# Patient Record
Sex: Female | Born: 1965
Health system: Southern US, Community
[De-identification: ages and names within clinical notes are randomized; demographics above are authoritative.]

## PROBLEM LIST (undated history)

## (undated) DIAGNOSIS — D649 Anemia, unspecified: Secondary | ICD-10-CM

## (undated) DIAGNOSIS — E079 Disorder of thyroid, unspecified: Secondary | ICD-10-CM

## (undated) DIAGNOSIS — B019 Varicella without complication: Secondary | ICD-10-CM

## (undated) DIAGNOSIS — T7840XA Allergy, unspecified, initial encounter: Secondary | ICD-10-CM

## (undated) HISTORY — DX: Anemia, unspecified: D64.9

## (undated) HISTORY — PX: JOINT REPLACEMENT: SHX530

## (undated) HISTORY — PX: KNEE ARTHROSCOPY: SUR90

## (undated) HISTORY — DX: Allergy, unspecified, initial encounter: T78.40XA

## (undated) HISTORY — DX: Varicella without complication: B01.9

## (undated) HISTORY — DX: Disorder of thyroid, unspecified: E07.9

---

## 2015-04-17 HISTORY — PX: JOINT REPLACEMENT: SHX530

## 2015-09-09 ENCOUNTER — Other Ambulatory Visit: Payer: Self-pay

## 2015-09-09 DIAGNOSIS — Z1231 Encounter for screening mammogram for malignant neoplasm of breast: Secondary | ICD-10-CM

## 2015-09-21 ENCOUNTER — Ambulatory Visit
Admission: RE | Admit: 2015-09-21 | Discharge: 2015-09-21 | Disposition: A | Discharge status: Home / Self Care | Payer: 59 | Source: Ambulatory Visit

## 2015-09-21 DIAGNOSIS — Z1231 Encounter for screening mammogram for malignant neoplasm of breast: Secondary | ICD-10-CM

## 2015-10-05 ENCOUNTER — Other Ambulatory Visit: Payer: Self-pay | Admitting: Obstetrics & Gynecology

## 2015-10-05 ENCOUNTER — Other Ambulatory Visit (HOSPITAL_COMMUNITY)
Admission: RE | Admit: 2015-10-05 | Discharge: 2015-10-05 | Disposition: A | Discharge status: Home / Self Care | Payer: 59 | Source: Ambulatory Visit | Attending: Obstetrics & Gynecology | Admitting: Obstetrics & Gynecology

## 2015-10-05 DIAGNOSIS — Z01419 Encounter for gynecological examination (general) (routine) without abnormal findings: Secondary | ICD-10-CM | POA: Insufficient documentation

## 2015-10-05 DIAGNOSIS — Z1151 Encounter for screening for human papillomavirus (HPV): Secondary | ICD-10-CM | POA: Diagnosis not present

## 2015-10-06 LAB — CYTOLOGY - PAP

## 2015-10-22 ENCOUNTER — Emergency Department (HOSPITAL_BASED_OUTPATIENT_CLINIC_OR_DEPARTMENT_OTHER)
Admission: EM | Admit: 2015-10-22 | Discharge: 2015-10-22 | Disposition: A | Discharge status: Home / Self Care | Payer: 59 | Attending: Emergency Medicine | Admitting: Emergency Medicine

## 2015-10-22 ENCOUNTER — Encounter (HOSPITAL_BASED_OUTPATIENT_CLINIC_OR_DEPARTMENT_OTHER): Payer: Self-pay | Admitting: *Deleted

## 2015-10-22 ENCOUNTER — Emergency Department (HOSPITAL_BASED_OUTPATIENT_CLINIC_OR_DEPARTMENT_OTHER): Payer: 59

## 2015-10-22 DIAGNOSIS — Z7982 Long term (current) use of aspirin: Secondary | ICD-10-CM | POA: Insufficient documentation

## 2015-10-22 DIAGNOSIS — Z79899 Other long term (current) drug therapy: Secondary | ICD-10-CM | POA: Insufficient documentation

## 2015-10-22 DIAGNOSIS — Z9660 Presence of unspecified orthopedic joint implant: Secondary | ICD-10-CM | POA: Diagnosis not present

## 2015-10-22 DIAGNOSIS — R0789 Other chest pain: Secondary | ICD-10-CM | POA: Diagnosis not present

## 2015-10-22 DIAGNOSIS — R0602 Shortness of breath: Secondary | ICD-10-CM | POA: Diagnosis not present

## 2015-10-22 DIAGNOSIS — R079 Chest pain, unspecified: Secondary | ICD-10-CM

## 2015-10-22 LAB — URINALYSIS, ROUTINE W REFLEX MICROSCOPIC
Bilirubin Urine: NEGATIVE
Glucose, UA: NEGATIVE mg/dL
Hgb urine dipstick: NEGATIVE
Ketones, ur: NEGATIVE mg/dL
Leukocytes, UA: NEGATIVE
Nitrite: NEGATIVE
Protein, ur: NEGATIVE mg/dL
Specific Gravity, Urine: 1.017 (ref 1.005–1.030)
pH: 5.5 (ref 5.0–8.0)

## 2015-10-22 LAB — BASIC METABOLIC PANEL
Anion gap: 8 (ref 5–15)
BUN: 12 mg/dL (ref 6–20)
CO2: 28 mmol/L (ref 22–32)
Calcium: 9.2 mg/dL (ref 8.9–10.3)
Chloride: 102 mmol/L (ref 101–111)
Creatinine, Ser: 0.82 mg/dL (ref 0.44–1.00)
GFR calc Af Amer: >60 mL/min (ref >60)
GFR calc non Af Amer: >60 mL/min (ref >60)
Glucose, Bld: 84 mg/dL (ref 65–99)
Potassium: 3.5 mmol/L (ref 3.5–5.1)
Sodium: 138 mmol/L (ref 135–145)

## 2015-10-22 LAB — BRAIN NATRIURETIC PEPTIDE: B NATRIURETIC PEPTIDE 5: 36.5 pg/mL (ref 0.0–100.0)

## 2015-10-22 LAB — CBC
HCT: 37 % (ref 36.0–46.0)
Hemoglobin: 12.2 g/dL (ref 12.0–15.0)
MCH: 27.7 pg (ref 26.0–34.0)
MCHC: 33 g/dL (ref 30.0–36.0)
MCV: 83.9 fL (ref 78.0–100.0)
PLATELETS: 296 10*3/uL (ref 150–400)
RBC: 4.41 MIL/uL (ref 3.87–5.11)
RDW: 15.3 % (ref 11.5–15.5)
WBC: 5.7 10*3/uL (ref 4.0–10.5)

## 2015-10-22 LAB — PREGNANCY, URINE: Preg Test, Ur: NEGATIVE

## 2015-10-22 LAB — TROPONIN I: Troponin I: <0.03 ng/mL (ref <0.03)

## 2015-10-22 LAB — D-DIMER, QUANTITATIVE: D-Dimer, Quant: 0.38 ug/mL-FEU (ref 0.00–0.50)

## 2015-10-22 MED ORDER — IOPAMIDOL (ISOVUE-370) INJECTION 76%
100.0000 mL | Freq: Once | INTRAVENOUS | Status: AC | PRN
  Administered 2015-10-22: 100 mL via INTRAVENOUS

## 2015-10-22 MED ORDER — PANTOPRAZOLE SODIUM 20 MG PO TBEC
20.0000 mg | DELAYED_RELEASE_TABLET | Freq: Two times a day (BID) | ORAL | Status: DC
Start: 2015-10-22 — End: 2020-11-10

## 2015-10-22 NOTE — ED Provider Notes (Signed)
CSN: [id number]     Arrival date & time 10/22/15  1237 History   First MD Initiated Contact with Patient 10/22/15 1303     No chief complaint on file.    (Consider location/radiation/quality/duration/timing/severity/associated sxs/prior Treatment) HPI   3-4 nights of chest pain with shortness of breath, waking up short of breath at night.  Chest pain severe, aching/pressure, worse laying down.  Worse when trying to sleep on right side.  Went to cross fit yesterday, and did not feel like pain got worse with exercise.  Chest pressure when laying down.  Tiny bit right now sitting up.  No hx of reflux.  No htn/hlpd/DM/smoking  No family hx of CAD  No hx of blood of blood clots, 6/15 surgery, Thursday drove 13hr to [facility] and back   History reviewed. No pertinent past medical history. Past Surgical History  Procedure Laterality Date  . Joint replacement     History reviewed. No pertinent family history. Social History  Substance Use Topics  . Smoking status: Never Smoker   . Smokeless tobacco: None  . Alcohol Use: Yes     Comment: occasional    OB History    No data available     Review of Systems  Constitutional: Negative for fever.  HENT: Negative for sore throat.   Eyes: Negative for visual disturbance.  Respiratory: Positive for shortness of breath. Negative for cough.   Cardiovascular: Positive for chest pain. Negative for leg swelling.  Gastrointestinal: Negative for nausea, vomiting and abdominal pain.  Genitourinary: Negative for difficulty urinating.  Musculoskeletal: Negative for back pain and neck pain.  Skin: Negative for rash.  Neurological: Negative for syncope, light-headedness and headaches.      Allergies  Review of patient's allergies indicates no known allergies.  Home Medications   Prior to Admission medications   Medication Sig Start Date End Date Taking? Authorizing Provider  aspirin 81 MG tablet Take 81 mg by mouth daily.   Yes Historical  Provider, MD  HYDROcodone-acetaminophen (NORCO/VICODIN) 5-325 MG tablet Take 1 tablet by mouth every 6 (six) hours as needed for moderate pain.   Yes Historical Provider, MD  ondansetron (ZOFRAN) 4 MG tablet Take 4 mg by mouth every 8 (eight) hours as needed for nausea or vomiting.   Yes Historical Provider, MD  pantoprazole (PROTONIX) 20 MG tablet Take 1 tablet (20 mg total) by mouth 2 (two) times daily. 10/22/15 11/22/15  Alvira Monday, MD   BP 119/66 mmHg  Pulse 61  Temp(Src) 97.8 F (36.6 C) (Oral)  Resp 16  Ht  (1.778 m)  Wt 160 lb (72.576 kg)  BMI 22.96 kg/m2  SpO2 100%  LMP 09/19/2015 (Approximate) Physical Exam  Constitutional: She is oriented to person, place, and time. She appears well-developed and well-nourished. No distress.  HENT:  Head: Normocephalic and atraumatic.  Eyes: Conjunctivae and EOM are normal.  Neck: Normal range of motion.  Cardiovascular: Normal rate, regular rhythm, normal heart sounds and intact distal pulses.  Exam reveals no gallop and no friction rub.   No murmur heard. Pulmonary/Chest: Effort normal and breath sounds normal. No respiratory distress. She has no wheezes. She has no rales.  Abdominal: Soft. She exhibits no distension. There is no tenderness. There is no guarding.  Musculoskeletal: She exhibits no edema or tenderness.  Neurological: She is alert and oriented to person, place, and time.  Skin: Skin is warm and dry. No rash noted. She is not diaphoretic. No erythema.  Nursing note and vitals  reviewed.   ED Course  Procedures (including critical care time) Labs Review Labs Reviewed  BASIC METABOLIC PANEL  CBC  TROPONIN I  PREGNANCY, URINE  URINALYSIS, ROUTINE W REFLEX MICROSCOPIC (NOT AT [facility])  D-DIMER, QUANTITATIVE (NOT AT [facility])  BRAIN NATRIURETIC PEPTIDE  TROPONIN I    Imaging Review Dg Chest 2 View  10/22/2015  CLINICAL DATA:  Midsternal chest pain EXAM: CHEST  2 VIEW COMPARISON:  None. FINDINGS: Normal mediastinum and  cardiac silhouette. Normal pulmonary vasculature. No evidence of effusion, infiltrate, or pneumothorax. No acute bony abnormality. Nipple shadows noted. IMPRESSION: Normal chest radiograph. Electronically Signed   By: Genevive Bi M.D.   On: 10/22/2015 13:36   Ct Angio Chest Pe W/cm &/or Wo Cm  10/22/2015  CLINICAL DATA:  50 year old female with a history of chest pain EXAM: CT ANGIOGRAPHY CHEST WITH CONTRAST TECHNIQUE: Multidetector CT imaging of the chest was performed using the standard protocol during bolus administration of intravenous contrast. Multiplanar CT image reconstructions and MIPs were obtained to evaluate the vascular anatomy. CONTRAST:  100 cc Isovue 370 COMPARISON:  Chest x-ray 10/22/2015 FINDINGS: Chest: Unremarkable appearance of the chest superficial soft tissues. No axillary or supraclavicular adenopathy. Incidental imaging of right thyroid lesion measuring 3.4 cm. Several mediastinal lymph nodes, none of which are enlarged by CT size criteria or have suspicious features. Unremarkable appearance of the esophagus. Unremarkable course caliber and contour of the thoracic aorta without dissection flap, aneurysm, periaortic fluid. No central, lobar, segmental, or proximal subsegmental filling defects to indicate pulmonary emboli. Heart size within normal limits without pericardial fluid/ thickening. No confluent airspace disease. No pneumothorax or pleural effusion. Upper abdomen: Unremarkable appearance of the visualized upper abdomen. Musculoskeletal: No displaced fracture.  No significant degenerative changes. Review of the MIP images confirms the above findings. IMPRESSION: Study is negative for pulmonary emboli. Incidental imaging of right thyroid lesion. Recommend referral for medical thyroid workup and ultrasound survey. Signed, Yvone Neu. Loreta Ave, DO [facility] Electronically Signed   By: Gilmer Mor D.O.   On: 10/22/2015 14:45    I have personally reviewed and evaluated these images and lab results as part of my medical decision-making.   EKG Interpretation   Date/Time:  Saturday October 22 2015 12:48:51 EDT Ventricular Rate:  68 PR Interval:    QRS Duration: 96 QT Interval:  393 QTC Calculation: 418 R Axis:   77 Text Interpretation:  Sinus rhythm No previous ECGs available Confirmed by  [facility] MD, Hosey Burmester ([id number]) on 10/22/2015 1:40:21 PM      MDM   Final diagnoses:  Chest pain, unspecified chest pain type  Shortness of breath   50 year old female with history of knee surgery on June 15, presents with concern of severe chest pressure, worse at night, and dyspnea. EKG was evaluated by me and showed a sinus rhythm without other acute ST abnormalities, no pericarditis. Chest x-ray showed no sign of pneumonia, no pneumothorax, heart failure. BNP within normal limits. CBC, troponin, and BNP within normal limits. While patient is low risk Wells, with negative ddimer, she reports severe pain and dyspnea, discussed other options and pt would like CT to ensure no PE and also screen for other abnormalities (i.e. mass causing positional symptoms.) CT within normal limits without pericardial thickening, no PE, no other large masses. Incidental right thyroid lesion and recommend outpatient follow up.  Patient is low risk HEAR score and delta troponins both negative.  Given this evaluation, history and physical have low  suspicion for pulmonary embolus, pneumonia, ACS, myocarditis, pericarditis, dissection.   Will rx BID PPI for possible reflux, and recommend close PCP follow up within one week. Patient discharged in stable condition with understanding of reasons to return.   Alvira MondayErin Trenity Pha, MD 10/22/15 [phone]

## 2015-10-22 NOTE — ED Notes (Signed)
Per pt report chest pain to center of chest with tightness that was off and on, recent rt knee surgery.

## 2016-11-14 ENCOUNTER — Other Ambulatory Visit: Payer: Self-pay | Admitting: Obstetrics & Gynecology

## 2016-11-14 DIAGNOSIS — Z1231 Encounter for screening mammogram for malignant neoplasm of breast: Secondary | ICD-10-CM

## 2016-11-20 ENCOUNTER — Ambulatory Visit: Payer: 59

## 2016-11-23 ENCOUNTER — Ambulatory Visit
Admission: RE | Admit: 2016-11-23 | Discharge: 2016-11-23 | Disposition: A | Discharge status: Home / Self Care | Payer: 59 | Source: Ambulatory Visit | Attending: Obstetrics & Gynecology | Admitting: Obstetrics & Gynecology

## 2016-11-23 DIAGNOSIS — Z1231 Encounter for screening mammogram for malignant neoplasm of breast: Secondary | ICD-10-CM

## 2017-10-10 ENCOUNTER — Other Ambulatory Visit: Payer: Self-pay | Admitting: Obstetrics & Gynecology

## 2017-10-10 DIAGNOSIS — Z1231 Encounter for screening mammogram for malignant neoplasm of breast: Secondary | ICD-10-CM

## 2017-10-30 ENCOUNTER — Encounter: Payer: Self-pay | Admitting: Sports Medicine

## 2017-10-30 ENCOUNTER — Ambulatory Visit: Payer: 59 | Admitting: Sports Medicine

## 2017-10-30 VITALS — BP 101/78 | Ht 69.0 in | Wt 170.0 lb

## 2017-10-30 DIAGNOSIS — M1731 Unilateral post-traumatic osteoarthritis, right knee: Secondary | ICD-10-CM

## 2017-10-30 DIAGNOSIS — M778 Other enthesopathies, not elsewhere classified: Secondary | ICD-10-CM

## 2017-10-30 HISTORY — DX: Other enthesopathies, not elsewhere classified: M77.8

## 2017-10-30 MED ORDER — DICLOFENAC SODIUM 1 % TD GEL: 2.0000 g | Freq: Four times a day (QID) | TRANSDERMAL | 1 refills | Status: DC

## 2017-10-30 NOTE — Patient Instructions (Signed)
ECU tendonitis:  Recommend wrist splint for protection and to prevent ulnar deviation/extension for 4-6 weeks. Will rx topical NSAID to be applied 3 times daily as needed for pain. Will give wrist exercises as well.  Post traumatic knee arthritis Mild arthritis noted on xrays from 2018. Missing terminal extension 5 degrees. Recommend home stretching twice daily to help with this. I also recommend [facility] physical therapy to try and regain full range of motion. Follow up in 4-6 weeks.

## 2017-10-30 NOTE — Progress Notes (Signed)
CC: L wrist pain, R knee pain  HPI: Patient is a otherwise healthy 52yo female who presents today with the chief complaint of L ulnar wrist pain and R knee stiffness.  Wrist: Wrist pain began 2 months ago while she was doing plank walks during cross-fit. She noted a pain in the ulnar aspect of her wrist which caused her to collapse to the ground. Afterwards, she was able to get back up however she had pain and mild swelling in the area. Since then, she has tried resting from cross-fit for 2 weeks which did not help. She intermittently take motrin which again does not help. Extending her wrist makes her pain worse. Pain seems to radiate proximally up her ulnar aspect of her left arm. Other sx: no numbness or tingling. No weakness. No clicking or popping. No elbow or shoulder pain. No prior injuries.  R knee: Pain began in 2017 after she has arthroscopic knee surgery for a medial/lateral meniscal injuries. Post surgery, she took part in 6 months of physical therapy but was unable to regain her terminal extension of her leg. She has tried cortisone intra-articular injections, PRP, and Physical therapy without relief. She is here today for a second opinion on what to do. No numbness or tingling. No knee pain. No popping, locking, or giving out. No R hip or ankle pain. Gait is mildly altered due to decreased ROM. She does notice a difference in cross-fit with this problem. Reviewed MRI 2018 demonstrating mild-moderate OA in lateral and medial joint line with chondromalacia patella.  Smoking: non smoker Family Hx: non contributory  ROS: Per HPI; in addition no fever, no rash, no additional weakness, no additional numbness, no additional paresthesias, and no additional falls/injury.   Objective: BP 101/78   Ht 5\' 9"  (1.753 m)   Wt 170 lb (77.1 kg)   BMI 25.10 kg/m  General: Well appearing, NAD, L Hand Dominant Head: [place]/AT Eyes: Conjunctiva clear Mouth: MMM Cardiac: R radial pulse 2/4 Lungs:  Breathing unlabored Skin: no rashes or skin lesions  MSK: MSK: Inspection of R knee demonstrates decreased terminal extension. No swelling or bruising noted. No tenderness to palpation over medial/lateral joint lines. No tenderness over IT band distal insertion. Patellofemoral crepitus with knee extension palpated. ROM: Active 5 Extension, 100 flexion, Passive 5 extension, 100 flexion, Strength 5/5 knee flexion and extension. DP 2/4, PT 2/4. Neg Lachman. Neg McMurray. Neg varus/valgus stress. Neg log roll. + patella grind.  Wrist: Inspection demonstrates no gross abnormalities. Tenderness to palpation over muscle belly of ECU tendon. No subluxation with supination/pronation. Full ROM in supination/pronation/extension and flexion. Strength 5/5 in F/E/ulnar/radial deviation although painful with ulnar deviation. Neg ulnar grind test although painful.  Neuro: Sensation intact throughout. No gross coordination deficits.  Gait: unremarkable stride without signs of limp or balance issues.  Assessment and Plan:  1. ECU tendonitis, left  - Wrist brace given as needed when performing exercise. Recommend relative rest from wrist extension/ulnar deviation for next 4 weeks - PRN NSAIDs oral - wrist exercises given to be performed 2x daily - discussed possibility of internal derangement, ie TFCC injury however less likely. If not improved, consider US of ECU tendon vs MRI if indicated.  2. Post-traumatic OA R knee w/ decreased extension  - recommend referral to PT for muscle imbalance and stretching - home stretches discussed with rolled towel bid increasing in duration of time to help regain terminal extension - f/u in 4-6 weeks if not improving, may benefit  from Corticosteroid injection if painful given OA however not currently tender today. - advised her that this issue will take 3-6 months to show improvement given time it has been since surgery and persistent 5-10 degree loss of  extension/stiffness.  Orders Placed This Encounter  Procedures  . Ambulatory referral to Physical Therapy    Referral Priority:   Routine    Referral Type:   Physical Medicine    Referral Reason:   Specialty Services Required    Requested Specialty:   Physical Therapy    Number of Visits Requested:   1    Meds ordered this encounter  Medications  . diclofenac sodium (VOLTAREN) 1 % GEL    Sig: Apply 2 g topically 4 (four) times daily.    Dispense:  100 g    Refill:  1     Christina Pho, DO [facility] Sports Medicine Fellow 10/30/2017 2:28 PM

## 2017-11-25 ENCOUNTER — Ambulatory Visit
Admission: RE | Admit: 2017-11-25 | Discharge: 2017-11-25 | Disposition: A | Discharge status: Home / Self Care | Payer: 59 | Source: Ambulatory Visit | Attending: Obstetrics & Gynecology | Admitting: Obstetrics & Gynecology

## 2017-11-25 DIAGNOSIS — Z1231 Encounter for screening mammogram for malignant neoplasm of breast: Secondary | ICD-10-CM

## 2017-12-11 ENCOUNTER — Ambulatory Visit: Payer: 59 | Admitting: Sports Medicine

## 2019-03-09 DIAGNOSIS — E041 Nontoxic single thyroid nodule: Secondary | ICD-10-CM | POA: Insufficient documentation

## 2019-08-17 ENCOUNTER — Other Ambulatory Visit: Payer: Self-pay | Admitting: Family Medicine

## 2019-08-17 DIAGNOSIS — Z1231 Encounter for screening mammogram for malignant neoplasm of breast: Secondary | ICD-10-CM

## 2019-09-30 ENCOUNTER — Ambulatory Visit
Admission: RE | Admit: 2019-09-30 | Discharge: 2019-09-30 | Disposition: A | Discharge status: Home / Self Care | Payer: 59 | Source: Ambulatory Visit | Attending: Family Medicine | Admitting: Family Medicine

## 2019-09-30 ENCOUNTER — Other Ambulatory Visit: Payer: Self-pay

## 2019-09-30 DIAGNOSIS — Z1231 Encounter for screening mammogram for malignant neoplasm of breast: Secondary | ICD-10-CM

## 2020-10-04 ENCOUNTER — Other Ambulatory Visit: Payer: Self-pay | Admitting: Family Medicine

## 2020-10-04 DIAGNOSIS — Z1231 Encounter for screening mammogram for malignant neoplasm of breast: Secondary | ICD-10-CM

## 2020-11-10 ENCOUNTER — Other Ambulatory Visit: Payer: Self-pay

## 2020-11-10 ENCOUNTER — Encounter: Payer: Self-pay | Admitting: Obstetrics & Gynecology

## 2020-11-10 ENCOUNTER — Ambulatory Visit (INDEPENDENT_AMBULATORY_CARE_PROVIDER_SITE_OTHER): Payer: 59 | Admitting: Obstetrics & Gynecology

## 2020-11-10 VITALS — BP 105/53 | HR 63 | Ht 70.0 in | Wt 147.6 lb

## 2020-11-10 DIAGNOSIS — Z01419 Encounter for gynecological examination (general) (routine) without abnormal findings: Secondary | ICD-10-CM

## 2020-11-10 NOTE — Progress Notes (Signed)
   WELL-WOMAN EXAMINATION Patient name: Christina Fisher MRN [id number]  Date of birth: 1966-02-17 Chief Complaint:   Annual Exam  History of Present Illness:   Christina Fisher is a 55 y.o. postmenopausal female being seen today for a routine well-woman exam.  Today she notes occasional night sweats- not an issue. She mentioned anxiety her certain settings like travel, but not on a regular basis.  Former [employer] patient- last seen 10/2019 for annual.  Noted to have thyromegaly   Last pap/HPV 10/2018 neg.  Last mammogram: 09/2019- scheduled Aug 2022. Hemocult card 05/2019 neg  Depression screen [facility] 2/9 11/10/2020  Decreased Interest 0  Down, Depressed, Hopeless 0  PHQ - 2 Score 0  Altered sleeping 1  Tired, decreased energy 0  Change in appetite 0  Feeling bad or failure about yourself  0  Trouble concentrating 1  Moving slowly or fidgety/restless 0  Suicidal thoughts 0  PHQ-9 Score 2      Review of Systems:   Pertinent items are noted in HPI Denies any headaches, blurred vision, fatigue, shortness of breath, chest pain, abdominal pain, bowel movements, urination, or intercourse unless otherwise stated above.  Pertinent History Reviewed:  Reviewed past medical,surgical, social and family history.  Reviewed problem list, medications and allergies. Physical Assessment:   Vitals:   11/10/20 1530  BP: (!) 105/53  Pulse: 63  Weight: 147 lb 9.6 oz (67 kg)  Height: 5\' 10"  (1.778 m)  Body mass index is 21.18 kg/m.        Physical Examination:   General appearance - well appearing, and in no distress  Mental status - alert, oriented to person, place, and time  Psych:  She has a normal mood and affect  Skin - warm and dry, normal color, no suspicious lesions noted  Chest - effort normal, all lung fields clear to auscultation bilaterally  Heart - normal rate and regular rhythm  Neck:  midline trachea, no thyromegaly or nodules  Breasts - breasts appear normal, no suspicious masses,  no skin or nipple changes or  axillary nodes  Abdomen - soft, nontender, nondistended, no masses or organomegaly  Pelvic - VULVA: normal appearing vulva with no masses, tenderness or lesions  VAGINA: normal appearing vagina with normal color and discharge, no lesions  CERVIX: normal appearing cervix without discharge or lesions, no CMT, atrophic changes noted  UTERUS: uterus is felt to be normal size, shape, consistency and nontender   ADNEXA: No adnexal masses or tenderness noted.  Extremities:  No swelling or varicosities noted  Chaperone:  declined      Assessment & Plan:  1) Well-Woman Exam Pap and mammogram up to date  2) []  next visit if pt comes fasting will do baseline lab work May consider establishing care wit hPCP  Meds: No orders of the defined types were placed in this encounter.   Follow-up: Return in about 1 year (around 11/10/2021) for Annual, with Dr. June.   11/12/2021, DO Attending Obstetrician & Gynecologist, [facility] for Christina Fisher, [facility]

## 2020-11-28 ENCOUNTER — Ambulatory Visit: Payer: 59

## 2020-11-29 ENCOUNTER — Ambulatory Visit
Admission: RE | Admit: 2020-11-29 | Discharge: 2020-11-29 | Disposition: A | Discharge status: Home / Self Care | Payer: 59 | Source: Ambulatory Visit | Attending: Family Medicine | Admitting: Family Medicine

## 2020-11-29 ENCOUNTER — Other Ambulatory Visit: Payer: Self-pay

## 2020-11-29 DIAGNOSIS — Z1231 Encounter for screening mammogram for malignant neoplasm of breast: Secondary | ICD-10-CM

## 2021-11-20 ENCOUNTER — Encounter: Payer: Self-pay | Admitting: Obstetrics & Gynecology

## 2021-11-20 ENCOUNTER — Other Ambulatory Visit (HOSPITAL_COMMUNITY)
Admission: RE | Admit: 2021-11-20 | Discharge: 2021-11-20 | Disposition: A | Discharge status: Home / Self Care | Payer: 59 | Source: Ambulatory Visit | Attending: Obstetrics & Gynecology | Admitting: Obstetrics & Gynecology

## 2021-11-20 ENCOUNTER — Ambulatory Visit (INDEPENDENT_AMBULATORY_CARE_PROVIDER_SITE_OTHER): Payer: 59 | Admitting: Obstetrics & Gynecology

## 2021-11-20 VITALS — BP 121/67 | HR 54 | Ht 69.0 in | Wt 150.0 lb

## 2021-11-20 DIAGNOSIS — N952 Postmenopausal atrophic vaginitis: Secondary | ICD-10-CM

## 2021-11-20 DIAGNOSIS — Z01419 Encounter for gynecological examination (general) (routine) without abnormal findings: Secondary | ICD-10-CM | POA: Diagnosis present

## 2021-11-20 DIAGNOSIS — N950 Postmenopausal bleeding: Secondary | ICD-10-CM | POA: Diagnosis not present

## 2021-11-20 NOTE — Progress Notes (Signed)
WELL-WOMAN EXAMINATION Patient name: Christina Fisher MRN [id number]  Date of birth: 1965/12/19 Chief Complaint:   Gynecologic Exam (Vaginal dryness comes and goes)  History of Present Illness:   Christina Fisher is a 56 y.o. G1P1000 PM female being seen today for a routine well-woman exam.   PMB: Today she notes vaginal dryness.  About 3 mos- noted blood when wiped.  BRB was noted and lasted for 2 days. On occasion will still see the BRB only a few drops. Occasional dysuria.  Denies urinary frequency/urgency.  Seen in urgent care- UA negative for UTI and they told her they could not complete a pelvic exam. Denies recent intercourse.  Patient's last menstrual period was 09/19/2015 (approximate).  The current method of family planning is  menopause .    Last pap 10/2018.  Last mammogram: 11/2020. Last colonoscopy: has cologuard kit at home     11/20/2021    8:32 AM 11/10/2020    3:25 PM  Depression screen PHQ 2/9  Decreased Interest 0 0  Down, Depressed, Hopeless 1 0  PHQ - 2 Score 1 0  Altered sleeping 1 1  Tired, decreased energy 1 0  Change in appetite 0 0  Feeling bad or failure about yourself  0 0  Trouble concentrating 0 1  Moving slowly or fidgety/restless 0 0  Suicidal thoughts 0 0  PHQ-9 Score 3 2      Review of Systems:   Pertinent items are noted in HPI Denies any headaches, blurred vision, fatigue, shortness of breath, chest pain, abdominal pain, bowel movements, urination, or intercourse unless otherwise stated above.  Pertinent History Reviewed:  Reviewed past medical,surgical, social and family history.  Reviewed problem list, medications and allergies. Physical Assessment:   Vitals:   11/20/21 0835  BP: 121/67  Pulse: (!) 54  Weight: 150 lb (68 kg)  Height: '5\' 9"'  (1.753 m)  Body mass index is 22.15 kg/m.        Physical Examination:   General appearance - well appearing, and in no distress  Mental status - alert, oriented to person, place, and  time  Psych:  She has a normal mood and affect  Skin - warm and dry, normal color, no suspicious lesions noted  Chest - effort normal, all lung fields clear to auscultation bilaterally  Heart - normal rate and regular rhythm  Neck:  midline trachea, no thyromegaly or nodules  Breasts - breasts appear normal, no suspicious masses, no skin or nipple changes or  axillary nodes  Abdomen - soft, nontender, nondistended, no masses or organomegaly  Pelvic - VULVA: normal appearing vulva with no masses, tenderness or lesions  VAGINA: normal appearing vagina with normal color and discharge, no lesions  CERVIX: increased vascularity noted of cervix, spotting noted with collection of Pap, no cervical discharge, no CMT  Thin prep pap is done with HR HPV cotesting  UTERUS: uterus is felt to be normal size, shape, consistency and nontender   ADNEXA: No adnexal masses or tenderness noted.  Extremities:  No swelling or varicosities noted  Chaperone:  pt declined      Assessment & Plan:  1) Well-Woman Exam -pap collected, reviewed screening guidelines -mammogram in Aug 2022 -to complete colonoscopy screening  2) Postmenopausal bleeding -reviewed most common- like atrophy to most concerning -advised pelvic US at next available -concern that bleeding may be due to cervical dysplasia or possible atrophic changes -for now recommended OTC vaginal moisturizers -further management pending results of [place]  Orders Placed This Encounter  Procedures   US PELVIC COMPLETE WITH TRANSVAGINAL    Meds: No orders of the defined types were placed in this encounter.   Follow-up: [place] at [facility], next step pending results, annual in 61yr  JJanyth Pupa DO Attending OToxey FLandmark Surgery Centerfor [facility]De[employer] CTimonium

## 2021-11-21 ENCOUNTER — Ambulatory Visit (HOSPITAL_BASED_OUTPATIENT_CLINIC_OR_DEPARTMENT_OTHER)
Admission: RE | Admit: 2021-11-21 | Discharge: 2021-11-21 | Disposition: A | Discharge status: Home / Self Care | Payer: 59 | Source: Ambulatory Visit | Attending: Obstetrics & Gynecology | Admitting: Obstetrics & Gynecology

## 2021-11-21 DIAGNOSIS — N950 Postmenopausal bleeding: Secondary | ICD-10-CM | POA: Insufficient documentation

## 2021-11-22 LAB — CYTOLOGY - PAP
Comment: NEGATIVE
Diagnosis: NEGATIVE
High risk HPV: NEGATIVE

## 2022-04-05 ENCOUNTER — Encounter: Payer: Self-pay | Admitting: Family Medicine

## 2022-04-13 ENCOUNTER — Ambulatory Visit: Payer: 59 | Admitting: Family Medicine

## 2022-04-13 ENCOUNTER — Encounter: Payer: Self-pay | Admitting: Family Medicine

## 2022-04-13 VITALS — BP 105/65 | HR 65 | Temp 97.7°F | Ht 68.5 in | Wt 151.0 lb

## 2022-04-13 DIAGNOSIS — Z7689 Persons encountering health services in other specified circumstances: Secondary | ICD-10-CM | POA: Diagnosis not present

## 2022-04-13 DIAGNOSIS — E041 Nontoxic single thyroid nodule: Secondary | ICD-10-CM | POA: Diagnosis not present

## 2022-04-13 DIAGNOSIS — M25561 Pain in right knee: Secondary | ICD-10-CM

## 2022-04-13 MED ORDER — MELOXICAM 15 MG PO TABS: 15.0000 mg | ORAL_TABLET | Freq: Every day | ORAL | 5 refills | Status: AC

## 2022-04-13 NOTE — Patient Instructions (Signed)
No follow-ups on file.        Great to see you today.    

## 2022-04-13 NOTE — Progress Notes (Signed)
Patient ID: Christina Fisher, female  DOB: 03-23-1966, 56 y.o.   MRN: [id number] Patient Care Team    Relationship Specialty Notifications Start End  Natalia Leatherwood, DO PCP - General Family Medicine  04/13/22   Lollie Marrow, MD Referring Physician Endocrinology  04/13/22   Myna Hidalgo, DO Consulting Physician Obstetrics and Gynecology  04/13/22   Caryn [facility] eye [facility] Referring Physician Optometry  04/13/22     Chief Complaint  Patient presents with   Establish Care   Knee Pain    Pt c/o R chronic knee pain; pt had surgery about 6 years; worsen in the last 2 days    Subjective: Christina Fisher is a 56 y.o.  female present for new patient establishment-acute appt. All past medical history, surgical history, allergies, family history, immunizations, medications and social history were updated in the electronic medical record today. All recent labs, ED visits and hospitalizations within the last year were reviewed.  Acute on chronic knee pain: Patient reports she had knee arthroscopy completed on her right knee in 2017 for meniscal injury.  She states she is never fully recovered back to her baseline.  She is a runner, running about 20 miles a week on average.  She reports occasionally her knee would bother her after running or swell.  However, over the last month she has had frequent knee pain and swelling after running, and over the last week she has noticed it almost every run.  She does admit prior to this week, she was not running as frequently around the holidays.  She does not wear a knee sleeve or take medications for her knee discomfort.  She reports she had a second opinion after her knee surgery in 2017, because her knee would not completely straighten after her arthroscopy procedure.  The second opinion physician reportedly told her that it is common to occur.  She reports she went through physical therapy after her surgery.  Thyroid nodule: Patient follows with endocrinology  Dr. Samuella Cota.  Has undergone FNA in 2020 and nodule was benign.  She has had repeat thyroid levels and ultrasounds, latest 12/27/2021 with her endocrinologist.  At that time she had no compressive symptoms and was under more stress with her husband's TIPS procedure and elected not to move forward with a hemithyroidectomy.  She has a right dominant thyroid nodule just over 5 cm with microcalcification.  Their plan was to follow-up in 1 year.     04/13/2022    1:44 PM 11/20/2021    8:32 AM 11/10/2020    3:25 PM  Depression screen PHQ 2/9  Decreased Interest 0 0 0  Down, Depressed, Hopeless 0 1 0  PHQ - 2 Score 0 1 0  Altered sleeping  1 1  Tired, decreased energy  1 0  Change in appetite  0 0  Feeling bad or failure about yourself   0 0  Trouble concentrating  0 1  Moving slowly or fidgety/restless  0 0  Suicidal thoughts  0 0  PHQ-9 Score  3 2      11/20/2021    8:33 AM 11/10/2020    3:25 PM  GAD 7 : Generalized Anxiety Score  Nervous, Anxious, on Edge 1 1  Control/stop worrying 1 1  Worry too much - different things 1 1  Trouble relaxing 1 1  Restless 0 1  Easily annoyed or irritable 0 1  Afraid - awful might happen 0 0  Total GAD 7  Score 4 6          11/20/2021    8:35 AM 11/10/2020    3:23 PM  Fall Risk   Falls in the past year? 0 0  Number falls in past yr:  0  Injury with Fall?  0    There is no immunization history on file for this patient.  No results found.  Past Medical History:  Diagnosis Date   Allergies    Anemia    Chicken pox    Left wrist tendonitis 10/30/2017   ECU tendonitis.     - Wrist brace given as needed when performing exercise. Recommend relative rest from wrist extension/ulnar deviation for next 4 weeks   Thyroid condition    No Known Allergies Past Surgical History:  Procedure Laterality Date   KNEE ARTHROSCOPY Right    meniscal injury (medial)   Family History  Problem Relation Age of Onset   Osteoarthritis Mother    Diabetes Father     Alcohol abuse Father    Osteoarthritis Sister    Depression Sister    Mental illness Sister    Diabetes Brother    Depression Brother    Diabetes Maternal Grandmother    Breast cancer Neg Hx    Social History   Social History Narrative   Marital status/children/pets: Married   Education/employment: Energy manager in education, employed as a Engineer, agricultural:      -Wears a bicycle helmet riding a bike: Yes     -smoke alarm in the home:Yes     - wears seatbelt: Yes     - Feels safe in their relationships: Yes       Allergies as of 04/13/2022   No Known Allergies      Medication List        Accurate as of April 13, 2022  5:12 PM. If you have any questions, ask your nurse or doctor.          AIRBORNE PO Take by mouth.   meloxicam 15 MG tablet Commonly known as: MOBIC Take 1 tablet (15 mg total) by mouth daily. Started by: Felix Pacini, DO        All past medical history, surgical history, allergies, family history, immunizations andmedications were updated in the EMR today and reviewed under the history and medication portions of their EMR.    No results found for this or any previous visit (from the past 2160 hour(s)).   ROS 14 pt review of systems performed and negative (unless mentioned in an HPI)  Objective: BP 105/65   Pulse 65   Temp 97.7 F (36.5 C) (Oral)   Ht 5' 8.5" (1.74 m)   Wt 151 lb (68.5 kg)   LMP 09/19/2015 (Approximate)   SpO2 98%   BMI 22.63 kg/m  Physical Exam Vitals and nursing note reviewed.  Constitutional:      General: She is not in acute distress.    Appearance: Normal appearance. She is normal weight. She is not ill-appearing or toxic-appearing.  HENT:     Head: Normocephalic and atraumatic.  Eyes:     Extraocular Movements: Extraocular movements intact.     Conjunctiva/sclera: Conjunctivae normal.     Pupils: Pupils are equal, round, and reactive to light.  Neurological:     Mental Status: She is alert and oriented  to person, place, and time. Mental status is at baseline.  Psychiatric:        Mood and Affect: Mood normal.  Behavior: Behavior normal.        Thought Content: Thought content normal.        Judgment: Judgment normal.     Assessment/plan: Christina Fisher is a 56 y.o. female present for est care Establishing care with new doctor, encounter for Thyroid nodule Statin placed with endocrinology Yearly follow-up with ultrasound and thyroid levels  Right knee pain, unspecified chronicity Current condition is an acute on chronic right knee discomfort. Would recommend neoprene sleeve with activity.  For now avoid running for at least 2 weeks. Start meloxicam 15 mg daily with food. Would like to obtain to gauge her arthritis level in her knee before deciding on physical therapy, orthopedics or sports med referral for her. - DG Knee Complete 4 Views Right; Future We will call her after we get her x-ray result.  No follow-ups on file.  Orders Placed This Encounter  Procedures   DG Knee Complete 4 Views Right   Meds ordered this encounter  Medications   meloxicam (MOBIC) 15 MG tablet    Sig: Take 1 tablet (15 mg total) by mouth daily.    Dispense:  30 tablet    Refill:  5   Referral Orders  No referral(s) requested today     Note is dictated utilizing voice recognition software. Although note has been proof read prior to signing, occasional typographical errors still can be missed. If any questions arise, please do not hesitate to call for verification.  Electronically signed by: Felix Pacini, DO [facility] [place]

## 2022-11-20 ENCOUNTER — Other Ambulatory Visit: Payer: Self-pay | Admitting: Family Medicine

## 2022-11-20 DIAGNOSIS — Z1231 Encounter for screening mammogram for malignant neoplasm of breast: Secondary | ICD-10-CM

## 2022-11-26 ENCOUNTER — Ambulatory Visit
Admission: RE | Admit: 2022-11-26 | Discharge: 2022-11-26 | Disposition: A | Discharge status: Home / Self Care | Payer: 59 | Source: Ambulatory Visit | Attending: Family Medicine | Admitting: Family Medicine

## 2022-11-26 DIAGNOSIS — Z1231 Encounter for screening mammogram for malignant neoplasm of breast: Secondary | ICD-10-CM

## 2023-08-14 IMAGING — MG MM DIGITAL SCREENING BILAT W/ TOMO AND CAD
8 series · 9 of 24 positions shown · non-contrast
Comparison: Previous exam(s).

CLINICAL DATA: Screening.

EXAM:
DIGITAL SCREENING BILATERAL MAMMOGRAM WITH TOMOSYNTHESIS AND CAD
TECHNIQUE: Bilateral screening digital craniocaudal and mediolateral oblique
mammograms were obtained. Bilateral screening digital breast
tomosynthesis was performed. The images were evaluated with
computer-aided detection.

[R MLO synth-2D]
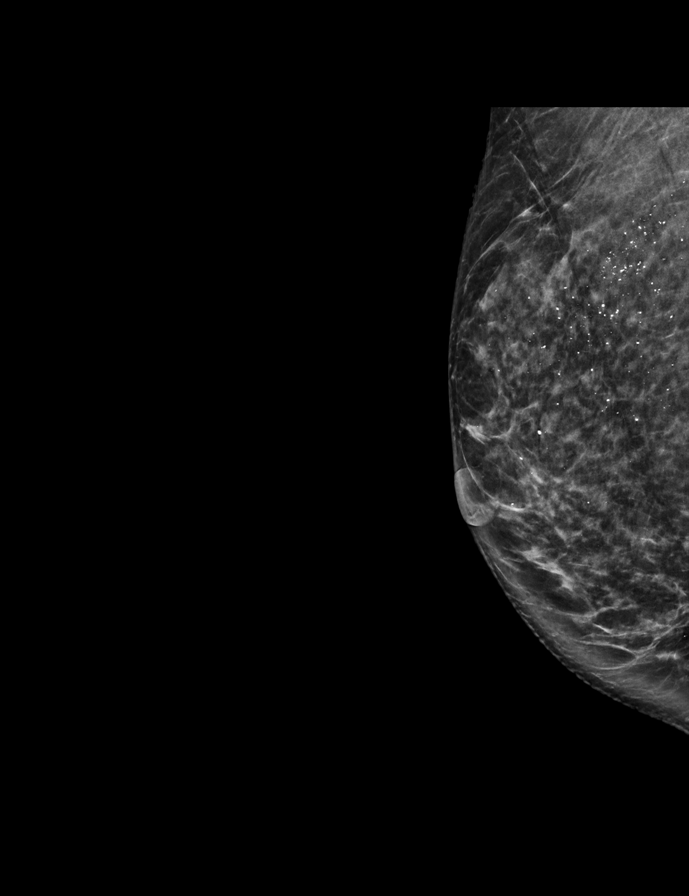

[R CC synth-2D]
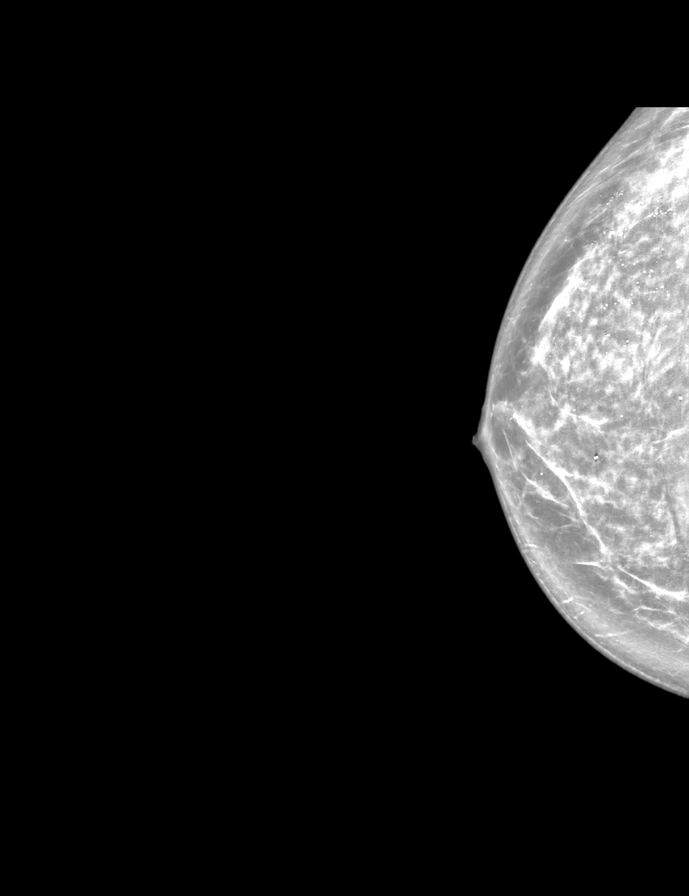

[L CC synth-2D]
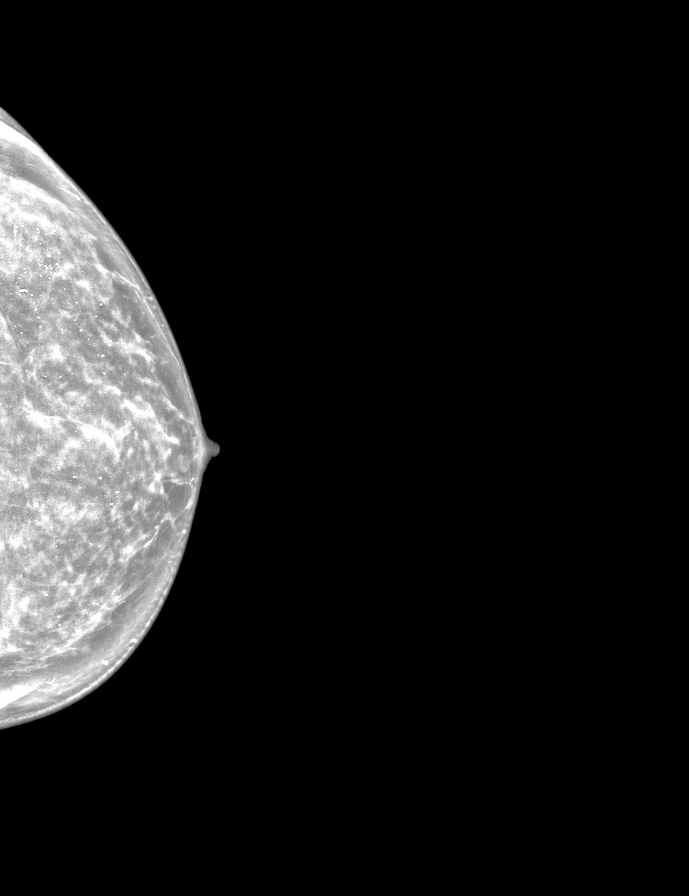

[L MLO synth-2D]
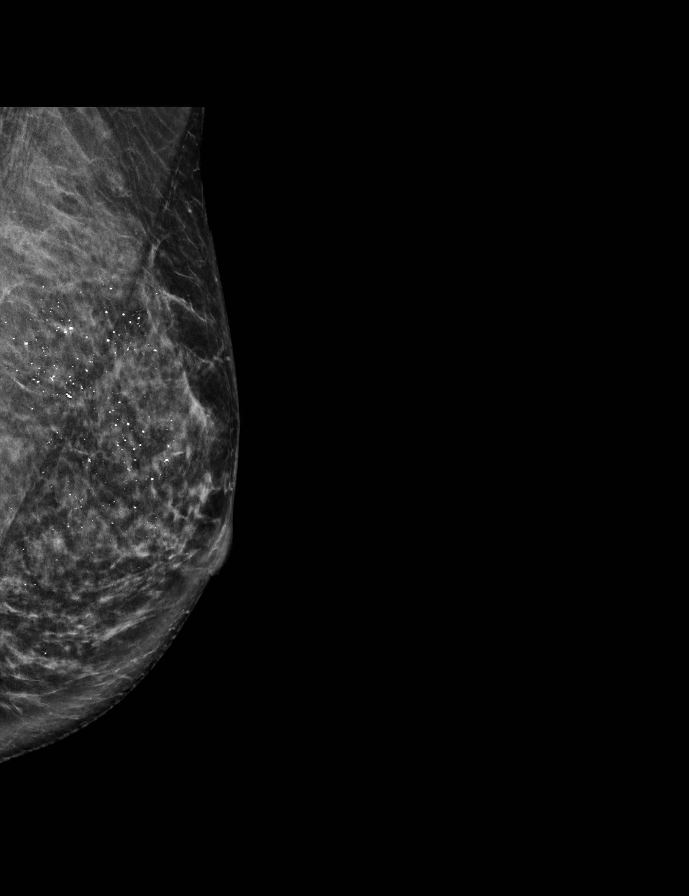

[R MLO tomo · 2 of 63 frames shown]
[frame 21/63]
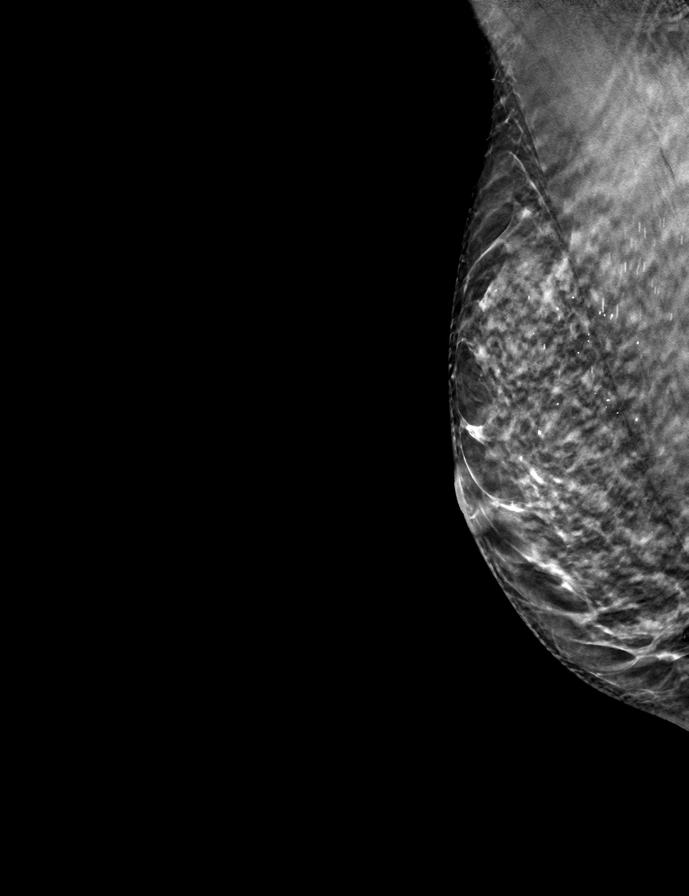
[frame 32/63]
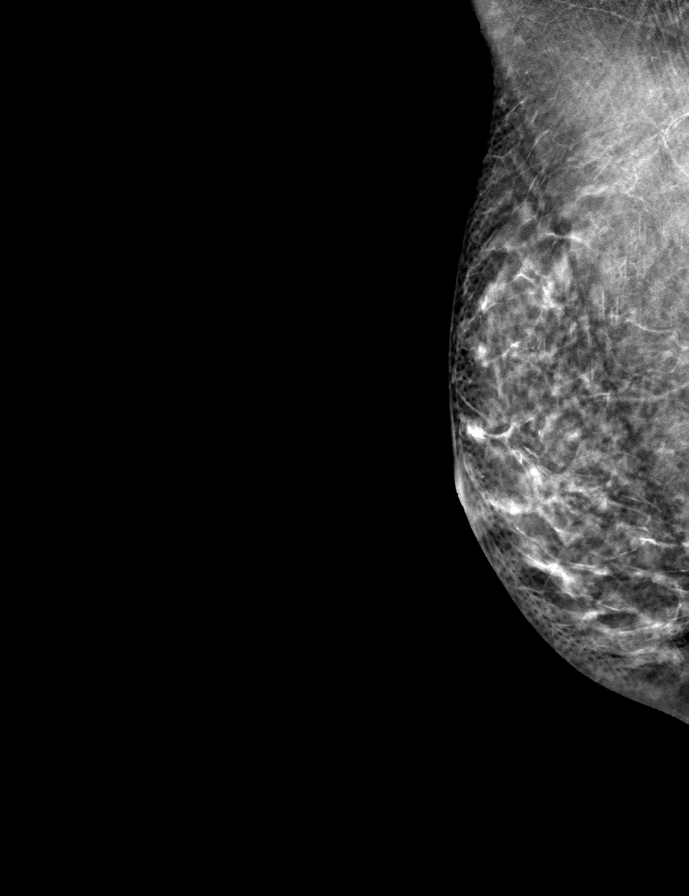

[L MLO tomo · tomo slice 35/68.0]
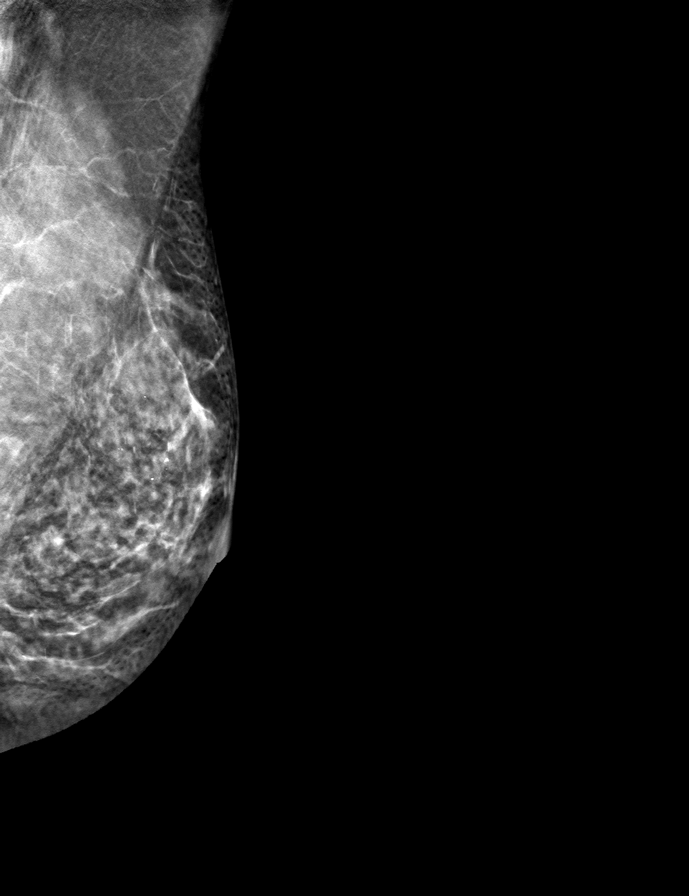

[R CC tomo · tomo slice 33/66.0]
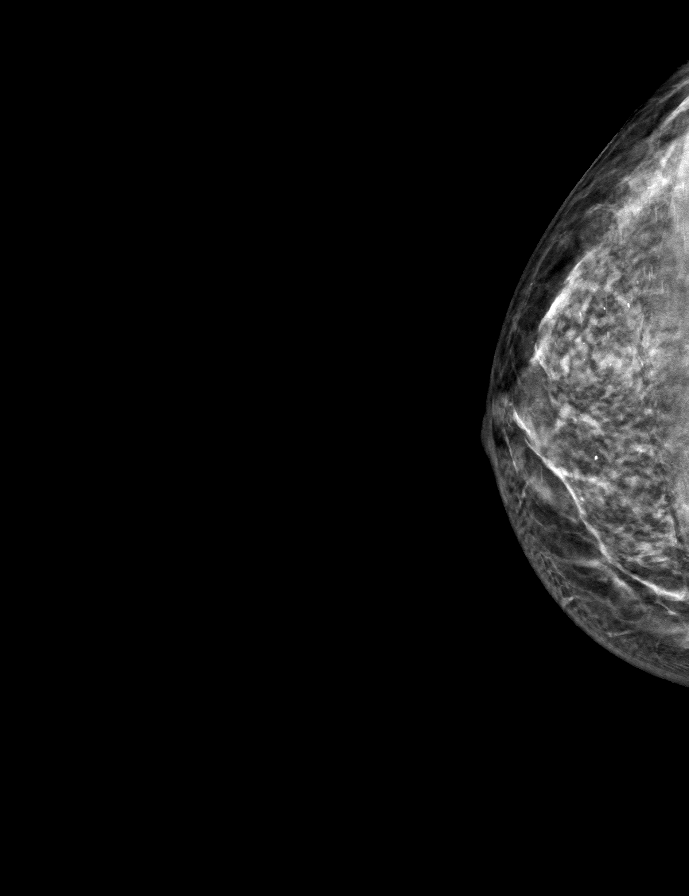

[L CC tomo · tomo slice 31/62.0]
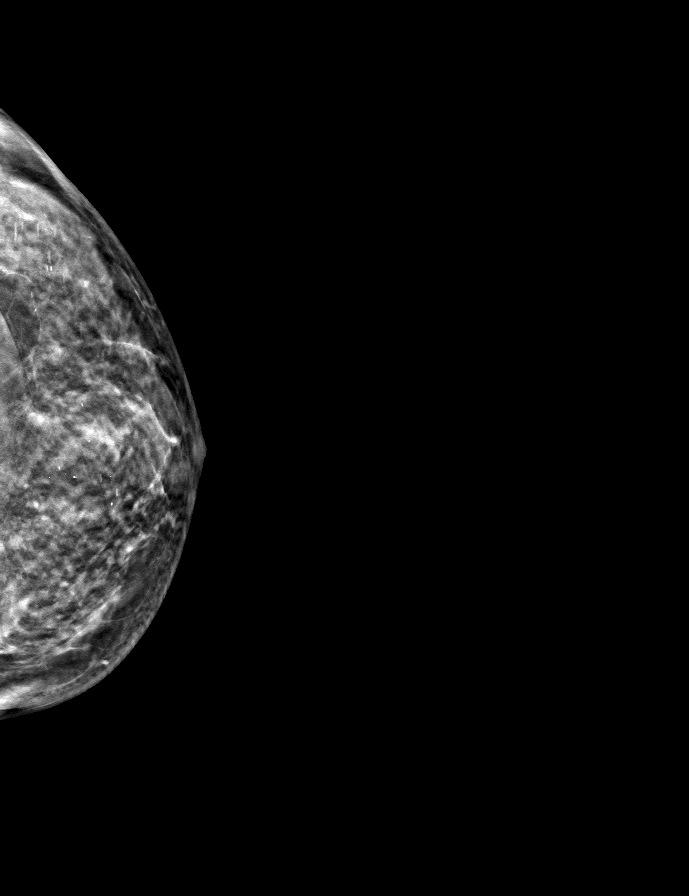

[9 of 24 positions shown; findings below may reference images not displayed]

ACR Breast Density Category c: The breast tissue is heterogeneously
dense, which may obscure small masses.
FINDINGS: There are no findings suspicious for malignancy.
IMPRESSION: No mammographic evidence of malignancy. A result letter of this
screening mammogram will be mailed directly to the patient.

RECOMMENDATION:
Screening mammogram in one year. (Code:Q3-W-BC3)

BI-RADS CATEGORY  1: Negative.
